# Patient Record
Sex: Male | Born: 1990 | Hispanic: No | Marital: Single | Smoking: Former smoker
Health system: Southern US, Community
[De-identification: ages and names within clinical notes are randomized; demographics above are authoritative.]

## PROBLEM LIST (undated history)

## (undated) HISTORY — PX: MANDIBLE SURGERY: SHX707

---
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
## 2011-01-21 LAB — POCT I-STAT, CHEM 8
BUN: 15 mg/dL (ref 6–23)
Calcium, Ion: 1.13 mmol/L (ref 1.12–1.32)
Chloride: 104 mEq/L (ref 96–112)
Creatinine, Ser: 0.9 mg/dL (ref 0.4–1.5)
Glucose, Bld: 102 mg/dL — ABNORMAL HIGH (ref 70–99)
Potassium: 3.7 mEq/L (ref 3.5–5.1)

## 2011-01-21 LAB — CBC
MCHC: 34.2 g/dL (ref 31.0–37.0)
MCV: 86.2 fL (ref 78.0–98.0)
Platelets: 221 10*3/uL (ref 150–400)
RDW: 13.1 % (ref 11.4–15.5)
WBC: 11.7 10*3/uL (ref 4.5–13.5)

## 2011-01-21 LAB — RAPID URINE DRUG SCREEN, HOSP PERFORMED
Amphetamines: NOT DETECTED
Benzodiazepines: NOT DETECTED
Cocaine: NOT DETECTED

## 2011-01-21 LAB — PROTIME-INR: Prothrombin Time: 13.4 seconds (ref 11.6–15.2)

## 2011-01-21 LAB — URINALYSIS, MICROSCOPIC ONLY
Bilirubin Urine: NEGATIVE
Glucose, UA: NEGATIVE mg/dL
Hgb urine dipstick: NEGATIVE
Ketones, ur: NEGATIVE mg/dL
Protein, ur: NEGATIVE mg/dL
Urobilinogen, UA: 0.2 mg/dL (ref 0.0–1.0)
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
## 2011-02-24 ENCOUNTER — Emergency Department (HOSPITAL_COMMUNITY): Payer: No Typology Code available for payment source

## 2011-02-24 ENCOUNTER — Emergency Department (HOSPITAL_COMMUNITY)
Admission: EM | Admit: 2011-02-24 | Discharge: 2011-02-24 | Disposition: A | Discharge status: Home / Self Care | Payer: No Typology Code available for payment source | Attending: Emergency Medicine | Admitting: Emergency Medicine

## 2011-02-24 DIAGNOSIS — M25529 Pain in unspecified elbow: Secondary | ICD-10-CM | POA: Insufficient documentation

## 2011-02-24 DIAGNOSIS — IMO0002 Reserved for concepts with insufficient information to code with codable children: Secondary | ICD-10-CM | POA: Insufficient documentation

## 2011-06-22 ENCOUNTER — Encounter: Payer: Self-pay | Admitting: *Deleted

## 2011-06-22 ENCOUNTER — Emergency Department (HOSPITAL_COMMUNITY)
Admission: EM | Admit: 2011-06-22 | Discharge: 2011-06-22 | Disposition: A | Discharge status: Home / Self Care | Payer: 59 | Attending: Emergency Medicine | Admitting: Emergency Medicine

## 2011-06-22 DIAGNOSIS — R200 Anesthesia of skin: Secondary | ICD-10-CM

## 2011-06-22 DIAGNOSIS — F17200 Nicotine dependence, unspecified, uncomplicated: Secondary | ICD-10-CM | POA: Insufficient documentation

## 2011-06-22 DIAGNOSIS — R209 Unspecified disturbances of skin sensation: Secondary | ICD-10-CM | POA: Insufficient documentation

## 2011-06-22 MED ORDER — IBUPROFEN 800 MG PO TABS
800.0000 mg | ORAL_TABLET | Freq: Once | ORAL | Status: AC
  Administered 2011-06-22: 800 mg via ORAL
  Filled 2011-06-22: qty 1

## 2011-06-22 NOTE — ED Provider Notes (Signed)
Medical screening examination/treatment/procedure(s) were performed by non-physician practitioner and as supervising physician I was immediately available for consultation/collaboration.  Donnetta Hutching, MD 06/22/11 [phone]

## 2011-06-22 NOTE — ED Notes (Signed)
Pt c/o numbness to his right index finger. Pt states his has contracts at night and in am he has to force his fingers open.

## 2011-06-22 NOTE — ED Provider Notes (Addendum)
History     CSN: [id number] Arrival date & time: 06/22/2011  5:06 PM   Chief Complaint  Patient presents with  . Numbness    right index finger     (Include location/radiation/quality/duration/timing/severity/associated sxs/prior treatment) HPI Comments: For thwe past 2 months pt has been awakening with numbness to distal radial aspect of R 2nd digit which improves during the day.  "sometimes my fingers cramp too".  The history is provided by the patient. No language interpreter was used.     History reviewed. No pertinent past medical history.   Past Surgical History  Procedure Date  . Mandible surgery     History reviewed. No pertinent family history.  History  Substance Use Topics  . Smoking status: Current Everyday Smoker -- 0.5 packs/day  . Smokeless tobacco: Not on file  . Alcohol Use: Yes     occasionally      Review of Systems  Neurological: Positive for numbness.  All other systems reviewed and are negative.    Allergies  Penicillins  Home Medications  No current outpatient prescriptions on file.  Physical Exam    BP 119/59  Pulse 69  Temp(Src) 98.5 F (36.9 C) (Oral)  Resp 20  Ht 5\' 11"  (1.803 m)  Wt 200 lb (90.719 kg)  BMI 27.89 kg/m2  SpO2 100%  Physical Exam  Nursing note and vitals reviewed. Constitutional: He is oriented to person, place, and time. Vital signs are normal. He appears well-developed and well-nourished. No distress.  HENT:  Head: Normocephalic and atraumatic.  Right Ear: External ear normal.  Left Ear: External ear normal.  Nose: Nose normal.  Mouth/Throat: No oropharyngeal exudate.  Eyes: Conjunctivae and EOM are normal. Pupils are equal, round, and reactive to light. Right eye exhibits no discharge. Left eye exhibits no discharge. No scleral icterus.  Neck: Normal range of motion. Neck supple. No JVD present. No tracheal deviation present. No thyromegaly present.  Cardiovascular: Normal rate, regular rhythm,  normal heart sounds, intact distal pulses and normal pulses.  Exam reveals no gallop and no friction rub.   No murmur heard. Pulmonary/Chest: Effort normal and breath sounds normal. No stridor. No respiratory distress. He has no wheezes. He has no rales. He exhibits no tenderness.  Abdominal: Soft. Normal appearance and bowel sounds are normal. He exhibits no distension and no mass. There is no tenderness. There is no rebound and no guarding.  Musculoskeletal: Normal range of motion. He exhibits no edema and no tenderness.       Hands: Lymphadenopathy:    He has no cervical adenopathy.  Neurological: He is alert and oriented to person, place, and time. He has normal reflexes. A sensory deficit is present. No cranial nerve deficit. He displays no seizure activity. Coordination normal. GCS eye subscore is 4. GCS verbal subscore is 5. GCS motor subscore is 6.  Skin: Skin is warm and dry. No rash noted. He is not diaphoretic.  Psychiatric: He has a normal mood and affect. His speech is normal and behavior is normal. Judgment and thought content normal. Cognition and memory are normal.    ED Course  Procedures  Results for orders placed during the hospital encounter of 10/21/08  ETHANOL      Component Value Range   Alcohol, Ethyl (B)   (*) 0 - 10 (mg/dL)   Value: 66            LOWEST DETECTABLE LIMIT FOR     SERUM ALCOHOL IS 5 mg/dL  FOR MEDICAL PURPOSES ONLY  CBC      Component Value Range   WBC 11.7  4.5 - 13.5 (K/uL)   RBC 5.18  3.80 - 5.70 (MIL/uL)   Hemoglobin 15.3  12.0 - 16.0 (g/dL)   HCT 40.9  81.1 - 91.4 (%)   MCV 86.2  78.0 - 98.0 (fL)   MCHC 34.2  31.0 - 37.0 (g/dL)   RDW 78.2  95.6 - 21.3 (%)   Platelets 221  150 - 400 (K/uL)  PROTIME-INR      Component Value Range   Prothrombin Time 13.4  11.6 - 15.2 (seconds)   INR 1.0  0.00 - 1.49   URINALYSIS, MICROSCOPIC ONLY      Component Value Range   Color, Urine YELLOW  YELLOW    Appearance CLEAR  CLEAR    Specific  Gravity, Urine 1.023  1.005 - 1.030    pH 7.0  5.0 - 8.0    Glucose, UA NEGATIVE  NEGATIVE (mg/dL)   Hgb urine dipstick NEGATIVE  NEGATIVE    Bilirubin Urine NEGATIVE  NEGATIVE    Ketones, ur NEGATIVE  NEGATIVE (mg/dL)   Protein, ur NEGATIVE  NEGATIVE (mg/dL)   Urobilinogen, UA 0.2  0.0 - 1.0 (mg/dL)   Nitrite NEGATIVE  NEGATIVE    Leukocytes, UA NEGATIVE  NEGATIVE    WBC, UA 0-2  <3 (WBC/hpf)   Squamous Epithelial / LPF RARE  RARE   POCT I-STAT, CHEM 8      Component Value Range   Sodium 142  135 - 145 (mEq/L)   Potassium 3.7  3.5 - 5.1 (mEq/L)   Chloride 104  96 - 112 (mEq/L)   BUN 15  6 - 23 (mg/dL)   Creatinine, Ser 0.9  0.4 - 1.5 (mg/dL)   Glucose, Bld 086 (*) 70 - 99 (mg/dL)   Calcium, Ion 5.78  4.69 - 1.32 (mmol/L)   TCO2 24  0 - 100 (mmol/L)   Hemoglobin 16.3 (*) 12.0 - 16.0 (g/dL)   HCT 62.9  52.8 - 41.3 (%)  DRUG SCREEN PANEL, EMERGENCY      Component Value Range   Opiates NONE DETECTED  NONE DETECTED    Cocaine NONE DETECTED  NONE DETECTED    Benzodiazepines NONE DETECTED  NONE DETECTED    Amphetamines NONE DETECTED  NONE DETECTED    Tetrahydrocannabinol NONE DETECTED  NONE DETECTED    Barbiturates    NONE DETECTED    Value: NONE DETECTED            DRUG SCREEN FOR MEDICAL PURPOSES     ONLY.  IF CONFIRMATION IS NEEDED     FOR ANY PURPOSE, NOTIFY LAB     WITHIN 5 DAYS.                LOWEST DETECTABLE LIMITS     FOR URINE DRUG SCREEN     Drug Class       Cutoff (ng/mL)     Amphetamine      1000     Barbiturate      200     Benzodiazepine   200     Tricyclics       300     Opiates          300     Cocaine          300     THC              50  No results found.   No diagnosis found.   MDM  Tinel's and Phalen's negative. HPI incorrectly states the R 2nd finger rather than the L 2nd finger.     Worthy Rancher, PA 06/22/11 1724  Worthy Rancher, PA 06/22/11 1729  Worthy Rancher, PA 07/06/11 [phone]

## 2011-07-12 NOTE — ED Provider Notes (Signed)
.  Medical screening examination/treatment/procedure(s) were performed by non-physician practitioner and as supervising physician I was immediately available for consultation/collaboration.  Donnetta Hutching, MD 07/12/11 2038

## 2011-07-18 LAB — STREP A DNA PROBE

## 2011-07-18 LAB — CBC
HCT: 41.8
Hemoglobin: 14.1
MCV: 83.8
Platelets: 231
RDW: 12.9

## 2011-07-18 LAB — DIFFERENTIAL
Basophils Absolute: 0
Eosinophils Absolute: 0
Eosinophils Relative: 0
Lymphocytes Relative: 26 — ABNORMAL LOW
Lymphs Abs: 3
Monocytes Absolute: 1.3 — ABNORMAL HIGH

## 2011-07-18 LAB — MONONUCLEOSIS SCREEN: Mono Screen: POSITIVE — AB

## 2011-07-18 LAB — RAPID STREP SCREEN (MED CTR MEBANE ONLY): Streptococcus, Group A Screen (Direct): NEGATIVE

## 2012-05-01 ENCOUNTER — Emergency Department (HOSPITAL_COMMUNITY)
Admission: EM | Admit: 2012-05-01 | Discharge: 2012-05-01 | Disposition: A | Discharge status: Home / Self Care | Payer: Self-pay | Attending: Emergency Medicine | Admitting: Emergency Medicine

## 2012-05-01 ENCOUNTER — Encounter (HOSPITAL_COMMUNITY): Payer: Self-pay

## 2012-05-01 DIAGNOSIS — S0181XA Laceration without foreign body of other part of head, initial encounter: Secondary | ICD-10-CM

## 2012-05-01 DIAGNOSIS — F17200 Nicotine dependence, unspecified, uncomplicated: Secondary | ICD-10-CM | POA: Insufficient documentation

## 2012-05-01 DIAGNOSIS — W269XXA Contact with unspecified sharp object(s), initial encounter: Secondary | ICD-10-CM | POA: Insufficient documentation

## 2012-05-01 DIAGNOSIS — S0180XA Unspecified open wound of other part of head, initial encounter: Secondary | ICD-10-CM | POA: Insufficient documentation

## 2012-05-01 MED ORDER — TETANUS-DIPHTH-ACELL PERTUSSIS 5-2.5-18.5 LF-MCG/0.5 IM SUSP
INTRAMUSCULAR | Status: AC
  Filled 2012-05-01: qty 0.5

## 2012-05-01 MED ORDER — TETANUS-DIPHTH-ACELL PERTUSSIS 5-2.5-18.5 LF-MCG/0.5 IM SUSP
0.5000 mL | Freq: Once | INTRAMUSCULAR | Status: AC
  Administered 2012-05-01: 0.5 mL via INTRAMUSCULAR

## 2012-05-01 NOTE — ED Provider Notes (Signed)
History     CSN: [id number]  Arrival date & time 05/01/12  1315   First MD Initiated Contact with Patient 05/01/12 1323      Chief Complaint  Patient presents with  . Laceration    (Consider location/radiation/quality/duration/timing/severity/associated sxs/prior treatment) HPI Pt sustained 1cm laceration to his chin about 4-5 days ago. Did not seek care at the time. Noticed some return of bleeding last night and so came for evaluation today. He has been applying antibiotic ointment and bandaid. No fever or drainage of pus. No pain.   History reviewed. No pertinent past medical history.  Past Surgical History  Procedure Date  . Mandible surgery     No family history on file.  History  Substance Use Topics  . Smoking status: Current Everyday Smoker -- 0.5 packs/day  . Smokeless tobacco: Not on file  . Alcohol Use: Yes     occasionally      Review of Systems All other systems reviewed and are negative except as noted in HPI.   Allergies  Penicillins  Home Medications  No current outpatient prescriptions on file.  BP 143/72  Pulse 65  Temp 98.4 F (36.9 C) (Oral)  Resp 20  Ht 5\' 10"  (1.778 m)  Wt 185 lb (83.915 kg)  BMI 26.54 kg/m2  SpO2 100%  Physical Exam  Constitutional: He is oriented to person, place, and time. He appears well-developed and well-nourished.  HENT:  Head: Normocephalic.       1cm healing laceration to chin, no signs of infection, too remote for repair.   Neck: Neck supple.  Pulmonary/Chest: Effort normal.  Neurological: He is alert and oriented to person, place, and time. No cranial nerve deficit.  Psychiatric: He has a normal mood and affect. His behavior is normal.    ED Course  Procedures (including critical care time)  Labs Reviewed - No data to display No results found.   No diagnosis found.    MDM  Remote laceration, no infection. Tdap, wound care instructions. PCP followup.         Charles B. Bernette Mayers,  MD 05/01/12 1328

## 2012-05-01 NOTE — ED Notes (Signed)
MD at bedside. 
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
## 2012-06-17 ENCOUNTER — Emergency Department (HOSPITAL_COMMUNITY)
Admission: EM | Admit: 2012-06-17 | Discharge: 2012-06-17 | Disposition: A | Discharge status: Home / Self Care | Payer: Self-pay | Attending: Emergency Medicine | Admitting: Emergency Medicine

## 2012-06-17 ENCOUNTER — Encounter (HOSPITAL_COMMUNITY): Payer: Self-pay

## 2012-06-17 DIAGNOSIS — M5432 Sciatica, left side: Secondary | ICD-10-CM

## 2012-06-17 DIAGNOSIS — M5430 Sciatica, unspecified side: Secondary | ICD-10-CM | POA: Insufficient documentation

## 2012-06-17 DIAGNOSIS — F17200 Nicotine dependence, unspecified, uncomplicated: Secondary | ICD-10-CM | POA: Insufficient documentation

## 2012-06-17 MED ORDER — OXYCODONE-ACETAMINOPHEN 5-325 MG PO TABS: 1.0000 | ORAL_TABLET | ORAL | Status: AC | PRN

## 2012-06-17 MED ORDER — CYCLOBENZAPRINE HCL 10 MG PO TABS: 10.0000 mg | ORAL_TABLET | Freq: Three times a day (TID) | ORAL | Status: AC | PRN

## 2012-06-17 MED ORDER — IBUPROFEN 800 MG PO TABS
800.0000 mg | ORAL_TABLET | Freq: Once | ORAL | Status: AC
  Administered 2012-06-17: 800 mg via ORAL
  Filled 2012-06-17: qty 1

## 2012-06-17 MED ORDER — OXYCODONE-ACETAMINOPHEN 5-325 MG PO TABS
1.0000 | ORAL_TABLET | Freq: Once | ORAL | Status: AC
  Administered 2012-06-17: 1 via ORAL
  Filled 2012-06-17: qty 1

## 2012-06-17 MED ORDER — CYCLOBENZAPRINE HCL 10 MG PO TABS
10.0000 mg | ORAL_TABLET | Freq: Once | ORAL | Status: AC
  Administered 2012-06-17: 10 mg via ORAL
  Filled 2012-06-17: qty 1

## 2012-06-17 NOTE — ED Provider Notes (Signed)
History   This chart was scribed for Bobby Booze, MD by Gerlean Ren. This patient was seen in room [facility] and the patient's care was started at 4:57PM.   CSN: [id number]  Arrival date & time 06/17/12  1623   First MD Initiated Contact with Patient 06/17/12 1650      Chief Complaint  Patient presents with  . Back Pain    (Consider location/radiation/quality/duration/timing/severity/associated sxs/prior treatment) Patient is a 21 y.o. male presenting with back pain. The history is provided by the patient. No language interpreter was used.  Back Pain   JASN XIA is a 21 y.o. male who presents to the Emergency Department complaining of 3 days of lower back pain radiating down left leg to the foot with associated tingling in left leg.  Pain is described as 10 out of 10 and is worsened when sitting and lifting both legs.  Pain is improved when standing.  Pain is not improved by tylenol or advil.  Pt denies h/o back pain.  Pt denies any heavy lifting or abnormal activity that may have caused pain.  Pt denies any bowel or bladder symptoms.  Pt is a current everyday smoker (0.5packs/day) and occasional alcohol use.     History reviewed. No pertinent past medical history.  Past Surgical History  Procedure Date  . Mandible surgery     No family history on file.  History  Substance Use Topics  . Smoking status: Current Every Day Smoker -- 0.5 packs/day  . Smokeless tobacco: Not on file  . Alcohol Use: Yes     occasionally      Review of Systems  Musculoskeletal: Positive for back pain.  All other systems reviewed and are negative.    Allergies  Penicillins  Home Medications  No current outpatient prescriptions on file.  BP 137/80  Pulse 86  Temp 98.3 F (36.8 C)  Resp 16  Ht 5\' 11"  (1.803 m)  Wt 185 lb (83.915 kg)  BMI 25.80 kg/m2  SpO2 100%  Physical Exam  Nursing note and vitals reviewed. Constitutional: He is oriented to person, place, and time. He  appears well-developed and well-nourished.  HENT:  Head: Normocephalic and atraumatic.  Eyes: Conjunctivae normal and EOM are normal. Pupils are equal, round, and reactive to light.  Neck: Normal range of motion. Neck supple.  Cardiovascular: Normal rate, regular rhythm and normal heart sounds.   Pulmonary/Chest: Effort normal and breath sounds normal.  Abdominal: Soft. Bowel sounds are normal.  Musculoskeletal: He exhibits tenderness.       Tingling down posterior lateral aspect of left calf and thigh. Mild tenderness in lower lumbar and left paralumbar region. Positive straight leg raise 15 degrees on left. Positive crossed straight leg raise 30 degrees on right.  Neurological: He is alert and oriented to person, place, and time.  Skin: Skin is warm and dry.  Psychiatric: He has a normal mood and affect.    ED Course  Procedures (including critical care time) DIAGNOSTIC STUDIES: Oxygen Saturation is 100% on room air, normal by my interpretation.    COORDINATION OF CARE: 4:58PM- Ordered percocet, flexeril, and ibuprofen for pain.     1. Left sided sciatica       MDM  He clearly has sciatica. I have discussed the cause with him as well as treatment. He will be given prescriptions for Flexeril and Percocet. He sees over-the-counter NSAIDs such as naproxen or ibuprofen.  I personally performed the services described in this documentation, which was  scribed in my presence. The recorded information has been reviewed and considered.          Bobby Booze, MD 06/17/12 1736

## 2012-06-17 NOTE — ED Notes (Signed)
Pt c/o back pain for 3 days that moves down left leg. Denies injury

## 2012-07-07 ENCOUNTER — Emergency Department (HOSPITAL_COMMUNITY)
Admission: EM | Admit: 2012-07-07 | Discharge: 2012-07-07 | Disposition: A | Discharge status: Home / Self Care | Payer: Self-pay | Attending: Emergency Medicine | Admitting: Emergency Medicine

## 2012-07-07 ENCOUNTER — Encounter (HOSPITAL_COMMUNITY): Payer: Self-pay

## 2012-07-07 ENCOUNTER — Emergency Department (HOSPITAL_COMMUNITY): Payer: Self-pay

## 2012-07-07 DIAGNOSIS — M545 Low back pain, unspecified: Secondary | ICD-10-CM | POA: Insufficient documentation

## 2012-07-07 DIAGNOSIS — Z880 Allergy status to penicillin: Secondary | ICD-10-CM | POA: Insufficient documentation

## 2012-07-07 DIAGNOSIS — F17200 Nicotine dependence, unspecified, uncomplicated: Secondary | ICD-10-CM | POA: Insufficient documentation

## 2012-07-07 DIAGNOSIS — M549 Dorsalgia, unspecified: Secondary | ICD-10-CM

## 2012-07-07 MED ORDER — OXYCODONE-ACETAMINOPHEN 5-325 MG PO TABS: 1.0000 | ORAL_TABLET | Freq: Four times a day (QID) | ORAL | Status: AC | PRN

## 2012-07-07 MED ORDER — PREDNISONE 10 MG PO TABS: 20.0000 mg | ORAL_TABLET | Freq: Every day | ORAL | Status: DC

## 2012-07-07 MED ORDER — CYCLOBENZAPRINE HCL 10 MG PO TABS: 10.0000 mg | ORAL_TABLET | Freq: Three times a day (TID) | ORAL | Status: DC | PRN

## 2012-07-07 MED ORDER — HYDROMORPHONE HCL PF 1 MG/ML IJ SOLN
1.0000 mg | Freq: Once | INTRAMUSCULAR | Status: AC
  Administered 2012-07-07: 1 mg via INTRAVENOUS
  Filled 2012-07-07: qty 1

## 2012-07-07 MED ORDER — ONDANSETRON HCL 4 MG/2ML IJ SOLN
4.0000 mg | Freq: Once | INTRAMUSCULAR | Status: AC
  Administered 2012-07-07: 4 mg via INTRAVENOUS
  Filled 2012-07-07: qty 2

## 2012-07-07 MED ORDER — METHYLPREDNISOLONE SODIUM SUCC 125 MG IJ SOLR
125.0000 mg | Freq: Once | INTRAMUSCULAR | Status: AC
  Administered 2012-07-07: 125 mg via INTRAVENOUS
  Filled 2012-07-07: qty 2

## 2012-07-07 NOTE — ED Notes (Signed)
Pt reports low back pain for 1 month was seen in er at that time, was given percocet at that time, is now out and unable to follow up with pmd.

## 2012-07-07 NOTE — ED Notes (Signed)
Patient with no complaints at this time. Respirations even and unlabored. Skin warm/dry. Discharge instructions reviewed with patient at this time. Patient given opportunity to voice concerns/ask questions. IV removed per policy and band-aid applied to site. Patient discharged at this time and left Emergency Department with steady gait.  

## 2012-07-07 NOTE — ED Notes (Signed)
[place], MD at bedside 

## 2012-07-07 NOTE — ED Provider Notes (Signed)
History  This chart was scribed for Bobby Lennert, MD by Ladona Ridgel Day. This patient was seen in room APA02/APA02 and the patient's care was started at 0858.   CSN: [id number]  Arrival date & time 07/07/12  7829   First MD Initiated Contact with Patient 07/07/12 1011      Chief Complaint  Patient presents with  . Back Pain   Patient is a 21 y.o. male presenting with back pain. The history is provided by the patient. No language interpreter was used.  Back Pain  This is a chronic problem. The current episode started more than 1 week ago. The problem occurs constantly. The problem has not changed since onset.The pain is associated with no known injury. The pain is present in the gluteal region and lumbar spine. The quality of the pain is described as shooting. The pain radiates to the left thigh. The pain is moderate. The symptoms are aggravated by bending. The pain is the same all the time. Pertinent negatives include no chest pain, no fever, no headaches, no abdominal pain and no bladder incontinence. Treatments tried: had flexeril and percocet when he was here 06/17/12 but his sx returned after he ran out of those medicines.  Bobby Hamilton is a 21 y.o. male who presents to the Emergency Department complaining of constant radiating back pain from his lumbar spine down his left leg for the past two months. He was seen here 06/17/12 for similar episode and states his symptoms returned once he was out of prescribed percocet and flexeril. 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 He states some associated tingling in his lower foot/toes and pain is worse with walking or bending at hips. He denies any alleviating factors.    History reviewed. No pertinent past medical history.  Past Surgical History  Procedure Date  . Mandible surgery     No family history on file.  History  Substance Use Topics  . Smoking status: Current Every Day Smoker --  0.5 packs/day    Types: Cigarettes  . Smokeless tobacco: Not on file  . Alcohol Use: Yes     occasionally      Review of Systems  Constitutional: Negative for fever and fatigue.  HENT: Negative for congestion, sinus pressure and ear discharge.   Eyes: Negative for discharge.  Respiratory: Negative for cough.   Cardiovascular: Negative for chest pain.  Gastrointestinal: Negative for abdominal pain and diarrhea.  Genitourinary: Negative for bladder incontinence, frequency and hematuria.  Musculoskeletal: Positive for back pain (lumbar back pain which radiates down his left leg).  Skin: Negative for rash.  Neurological: Negative for seizures and headaches.  Hematological: Negative.   Psychiatric/Behavioral: Negative for hallucinations.  All other systems reviewed and are negative.    Allergies  Penicillins  Home Medications   Current Outpatient Rx  Name Route Sig Dispense Refill  . CYCLOBENZAPRINE HCL 10 MG PO TABS Oral Take 10 mg by mouth 3 (three) times daily as needed. Muscle Spasms    . OXYCODONE-ACETAMINOPHEN 5-325 MG PO TABS Oral Take 1 tablet by mouth every 4 (four) hours as needed. Pain      Triage Vitals: BP 124/70  Pulse 84  Temp 98.2 F (36.8 C) (Oral)  Resp 20  Ht 5\' 10"  (1.778 m)  Wt 185 lb (83.915 kg)  BMI 26.54 kg/m2  SpO2 100%  Physical Exam  Nursing note and vitals reviewed. Constitutional: He is oriented to  person, place, and time. He appears well-developed.  HENT:  Head: Normocephalic.  Eyes: Conjunctivae normal are normal.  Neck: No tracheal deviation present.  Cardiovascular:  No murmur heard. Musculoskeletal: Normal range of motion. He exhibits tenderness (left lumbar spine tenderness).  Neurological: He is oriented to person, place, and time.       Positive left SLR   Skin: Skin is warm.  Psychiatric: He has a normal mood and affect.    ED Course  Procedures (including critical care time) DIAGNOSTIC STUDIES: Oxygen Saturation is  100% on room air, normal by my interpretation.    COORDINATION OF CARE: At 1015 AM Discussed treatment plan with patient which includes solu-medrol, dilaudid/zofran and lumbar spine X-ray. Patient agrees.   Labs Reviewed - No data to display No results found.   No diagnosis found.    MDM  The chart was scribed for me under my direct supervision.  I personally performed the history, physical, and medical decision making and all procedures in the evaluation of this patient.Bobby Lennert, MD 07/07/12 [phone]

## 2013-01-03 ENCOUNTER — Encounter (HOSPITAL_COMMUNITY): Payer: Self-pay | Admitting: Emergency Medicine

## 2013-01-03 ENCOUNTER — Emergency Department (HOSPITAL_COMMUNITY): Payer: Self-pay

## 2013-01-03 ENCOUNTER — Emergency Department (HOSPITAL_COMMUNITY)
Admission: EM | Admit: 2013-01-03 | Discharge: 2013-01-03 | Disposition: A | Discharge status: Home / Self Care | Payer: Self-pay | Attending: Emergency Medicine | Admitting: Emergency Medicine

## 2013-01-03 DIAGNOSIS — M79672 Pain in left foot: Secondary | ICD-10-CM

## 2013-01-03 DIAGNOSIS — Z87891 Personal history of nicotine dependence: Secondary | ICD-10-CM | POA: Insufficient documentation

## 2013-01-03 DIAGNOSIS — M79609 Pain in unspecified limb: Secondary | ICD-10-CM | POA: Insufficient documentation

## 2013-01-03 NOTE — ED Provider Notes (Signed)
History  This chart was scribed for Donnetta Hutching, MD, by Candelaria Stagers, ED Scribe. This patient was seen in room APA12/APA12 and the patient's care was started at 11:34 AM   CSN: [id number]  Arrival date & time 01/03/13  1028   First MD Initiated Contact with Patient 01/03/13 1100      Chief Complaint  Patient presents with  . Foot Pain    The history is provided by the patient. No language interpreter was used.   Bobby Hamilton is a 22 y.o. male who presents to the Emergency Department complaining of gradually worsening left foot pain that started yesterday after he reports he stepped on the foot wrong.  He reports that he is unable to bear weight on the foot.  Pt is also experiencing mild associated swelling to the left foot.       History reviewed. No pertinent past medical history.  Past Surgical History  Procedure Laterality Date  . Mandible surgery      History reviewed. No pertinent family history.  History  Substance Use Topics  . Smoking status: Former Smoker -- 0.50 packs/day  . Smokeless tobacco: Not on file  . Alcohol Use: Yes     Comment: occasionally      Review of Systems  Musculoskeletal: Positive for joint swelling (mild swelling to lateral dorsal aspect of left foot) and arthralgias (left foot pain).  All other systems reviewed and are negative.    Allergies  Penicillins  Home Medications  No current outpatient prescriptions on file.  BP 116/85  Pulse 58  Temp(Src) 98.2 F (36.8 C) (Oral)  Resp 17  Physical Exam  Nursing note and vitals reviewed. Constitutional: He is oriented to person, place, and time. He appears well-developed and well-nourished. No distress.  HENT:  Head: Normocephalic and atraumatic.  Eyes: EOM are normal.  Neck: Neck supple. No tracheal deviation present.  Musculoskeletal: Normal range of motion. He exhibits tenderness.  Tenderness over the dorsal lateral aspect of left foot.   Neurological: He is alert and  oriented to person, place, and time.  Neurovascularly intact.  Sensation normal.   Skin: Skin is warm and dry.  Psychiatric: He has a normal mood and affect. His behavior is normal.    ED Course  Procedures  DIAGNOSTIC STUDIES:   COORDINATION OF CARE:  11:37 AM Discussed images with pt and course of care which includes rest, ice, and elevation.  Pt understands and agrees.    Labs Reviewed - No data to display Dg Foot Complete Left  01/03/2013  *RADIOLOGY REPORT*  Clinical Data: Injury, pain  LEFT FOOT - COMPLETE 3+ VIEW  Comparison: None.  Findings: Normal alignment without fracture.  Preserved joint spaces.  No soft tissue abnormality or radiopaque foreign body.  IMPRESSION: No acute finding   Original Report Authenticated By: Judie Petit. Miles Costain, M.D.      No diagnosis found.    MDM  X-ray negative.  Ice, elevate, OTC pain med I personally performed the services described in this documentation, which was scribed in my presence. The recorded information has been reviewed and is accurate.        Donnetta Hutching, MD 01/11/13 1410

## 2013-01-03 NOTE — ED Notes (Signed)
Pt stepped wrong yesterday and now c/o L foot pain to lateral side. Has hx of fx in this foot also. Slight swelling noted.

## 2014-02-04 ENCOUNTER — Emergency Department (HOSPITAL_COMMUNITY)
Admission: EM | Admit: 2014-02-04 | Discharge: 2014-02-04 | Disposition: A | Discharge status: Home / Self Care | Payer: Managed Care, Other (non HMO) | Attending: Emergency Medicine | Admitting: Emergency Medicine

## 2014-02-04 ENCOUNTER — Encounter (HOSPITAL_COMMUNITY): Payer: Self-pay | Admitting: Emergency Medicine

## 2014-02-04 ENCOUNTER — Emergency Department (HOSPITAL_COMMUNITY): Payer: Managed Care, Other (non HMO)

## 2014-02-04 DIAGNOSIS — Y9383 Activity, rough housing and horseplay: Secondary | ICD-10-CM | POA: Insufficient documentation

## 2014-02-04 DIAGNOSIS — Z87891 Personal history of nicotine dependence: Secondary | ICD-10-CM | POA: Insufficient documentation

## 2014-02-04 DIAGNOSIS — IMO0002 Reserved for concepts with insufficient information to code with codable children: Secondary | ICD-10-CM | POA: Insufficient documentation

## 2014-02-04 DIAGNOSIS — Z880 Allergy status to penicillin: Secondary | ICD-10-CM | POA: Insufficient documentation

## 2014-02-04 DIAGNOSIS — Y929 Unspecified place or not applicable: Secondary | ICD-10-CM | POA: Insufficient documentation

## 2014-02-04 DIAGNOSIS — S20219A Contusion of unspecified front wall of thorax, initial encounter: Secondary | ICD-10-CM

## 2014-02-04 MED ORDER — ACETAMINOPHEN-CODEINE #3 300-30 MG PO TABS: 1.0000 | ORAL_TABLET | Freq: Four times a day (QID) | ORAL | Status: AC | PRN

## 2014-02-04 MED ORDER — METHOCARBAMOL 500 MG PO TABS: 500.0000 mg | ORAL_TABLET | Freq: Three times a day (TID) | ORAL | Status: AC

## 2014-02-04 MED ORDER — DICLOFENAC SODIUM 75 MG PO TBEC: 75.0000 mg | DELAYED_RELEASE_TABLET | Freq: Two times a day (BID) | ORAL | Status: AC

## 2014-02-04 NOTE — ED Provider Notes (Signed)
CSN: [id number]     Arrival date & time 02/04/14  [id number] History   First MD Initiated Contact with Patient 02/04/14 [phone]     Chief Complaint  Patient presents with  . Chest Pain     (Consider location/radiation/quality/duration/timing/severity/associated sxs/prior Treatment) HPI Comments: Patient is a 23 year old male who presents to the emergency department with complaint of right chest wall/rib area pain. The patient states that 4 days ago he was wrestling with a friend and he took a knee to the upper right rib area. Since that time he has had pain with movement, breathing, and certain positions. He denies any hemoptysis. He denies any high fevers. He denies any blood in the urine. And his been no blood in the stool. He denies being on any anticoagulation medications, and he denies any bleeding disorders. He presents now for evaluation because he continues to have pain in this area and is concerned about fracture.  Patient is a 23 y.o. male presenting with chest pain.  Chest Pain Associated symptoms: no abdominal pain, no back pain, no cough, no dizziness, no palpitations and no shortness of breath     History reviewed. No pertinent past medical history. Past Surgical History  Procedure Laterality Date  . Mandible surgery     History reviewed. No pertinent family history. History  Substance Use Topics  . Smoking status: Former Smoker -- 0.50 packs/day  . Smokeless tobacco: Not on file  . Alcohol Use: Yes     Comment: occasionally    Review of Systems  Constitutional: Negative for activity change.       All ROS Neg except as noted in HPI  HENT: Negative for nosebleeds.   Eyes: Negative for photophobia and discharge.  Respiratory: Negative for cough, shortness of breath, wheezing and stridor.   Cardiovascular: Positive for chest pain. Negative for palpitations.  Gastrointestinal: Negative for abdominal pain and blood in stool.  Genitourinary: Negative for dysuria, frequency and  hematuria.  Musculoskeletal: Negative for arthralgias, back pain and neck pain.  Skin: Negative.   Neurological: Negative for dizziness, seizures and speech difficulty.  Psychiatric/Behavioral: Negative for hallucinations and confusion.      Allergies  Penicillins  Home Medications   Prior to Admission medications   Not on File   BP 137/85  Pulse 76  Temp(Src) 98.2 F (36.8 C) (Oral)  Resp 18  SpO2 100% Physical Exam  Nursing note and vitals reviewed. Constitutional: He is oriented to person, place, and time. He appears well-developed and well-nourished.  Non-toxic appearance.  HENT:  Head: Normocephalic.  Right Ear: Tympanic membrane and external ear normal.  Left Ear: Tympanic membrane and external ear normal.  Eyes: EOM and lids are normal. Pupils are equal, round, and reactive to light.  Neck: Normal range of motion. Neck supple. Carotid bruit is not present.  Cardiovascular: Normal rate, regular rhythm, normal heart sounds, intact distal pulses and normal pulses.   Pulmonary/Chest: Breath sounds normal. No respiratory distress.  There is right upper chest wall tenderness to palpation and with change of position. There is no bruising appreciated. There is no palpable deformity appreciated. There is no crepitus present.  The patient speaks in complete sentences without problem.  Abdominal: Soft. Bowel sounds are normal. There is no tenderness. There is no guarding.  Musculoskeletal: Normal range of motion.  Lymphadenopathy:       Head (right side): No submandibular adenopathy present.       Head (left side): No submandibular adenopathy present.  He has no cervical adenopathy.  Neurological: He is alert and oriented to person, place, and time. He has normal strength. No cranial nerve deficit or sensory deficit.  Skin: Skin is warm and dry.  Psychiatric: He has a normal mood and affect. His speech is normal.    ED Course  Procedures (including critical care  time) Labs Review Labs Reviewed - No data to display  Imaging Review Dg Ribs Unilateral W/chest Right  02/04/2014   CLINICAL DATA:  Hit anterior right mid ribs 3 days ago  EXAM: RIGHT RIBS AND CHEST - 3+ VIEW  COMPARISON:  None.  FINDINGS: Heart size and vascular pattern normal. No consolidation effusion or pneumothorax. No rib fractures identified.  IMPRESSION: Negative.   Electronically Signed   By: Esperanza Heiraymond  Rubner M.D.   On: 02/04/2014 10:39     EKG Interpretation None      MDM X-ray of the chest and right ribs read as negative for fracture or pneumothorax.  Vital signs are well within normal limits. The pulse oximetry is 100% on room air. Within normal limits by my interpretation.  The plan at this time is for the patient to be given a sentence barometer to be used every 2-3 hours. He'll be given a prescription for diclofenac, baclofen, and Tylenol with Codeine. Patient is to follow with his primary physician if not improving.    Final diagnoses:  Contusion of ribs    *I have reviewed nursing notes, vital signs, and all appropriate lab and imaging results for this patient.Kathie Dike**    Elgene Coral M Kyonna Frier, PA-C 02/04/14 1110

## 2014-02-04 NOTE — ED Provider Notes (Signed)
Medical screening examination/treatment/procedure(s) were performed by non-physician practitioner and as supervising physician I was immediately available for consultation/collaboration.   EKG Interpretation None        Jaycey Gens L Micaella Gitto, MD 02/04/14 1540 

## 2014-02-04 NOTE — Discharge Instructions (Signed)
Your chest x-ray is negative for any problem with the lungs. Your rib x-ray is negative for rib fracture. Your oxygen level is 100%. Please use the incentive spirometer 2-3 times daily for the next 5 days. Please use Robaxin and Voltaren daily. May use Tylenol codeine for pain if needed. This medication and Chest Contusion A chest contusion is a deep bruise on your chest area. Contusions are the result of an injury that caused bleeding under the skin. A chest contusion may involve bruising of the skin, muscles, or ribs. The contusion may turn blue, purple, or yellow. Minor injuries will give you a painless contusion, but more severe contusions may stay painful and swollen for a few weeks. CAUSES  A contusion is usually caused by a blow, trauma, or direct force to an area of the body. SYMPTOMS   Swelling and redness of the injured area.  Discoloration of the injured area.  Tenderness and soreness of the injured area.  Pain. DIAGNOSIS  The diagnosis can be made by taking a history and performing a physical exam. An X-ray, CT scan, or MRI may be needed to determine if there were any associated injuries, such as broken bones (fractures) or internal injuries. TREATMENT  Often, the best treatment for a chest contusion is resting, icing, and applying cold compresses to the injured area. Deep breathing exercises may be recommended to reduce the risk of pneumonia. Over-the-counter medicines may also be recommended for pain control. HOME CARE INSTRUCTIONS   Put ice on the injured area.  Put ice in a plastic bag.  Place a towel between your skin and the bag.  Leave the ice on for 15-20 minutes, 03-04 times a day.  Only take over-the-counter or prescription medicines as directed by your caregiver. Your caregiver may recommend avoiding anti-inflammatory medicines (aspirin, ibuprofen, and naproxen) for 48 hours because these medicines may increase bruising.  Rest the injured area.  Perform  deep-breathing exercises as directed by your caregiver.  Stop smoking if you smoke.  Do not lift objects over 5 pounds (2.3 kg) for 3 days or longer if recommended by your caregiver. SEEK IMMEDIATE MEDICAL CARE IF:   You have increased bruising or swelling.  You have pain that is getting worse.  You have difficulty breathing.  You have dizziness, weakness, or fainting.  You have blood in your urine or stool.  You cough up or vomit blood.  Your swelling or pain is not relieved with medicines. MAKE SURE YOU:   Understand these instructions.  Will watch your condition.  Will get help right away if you are not doing well or get worse. Document Released: 06/18/2001 Document Revised: 06/17/2012 Document Reviewed: 03/16/2012 [facility]ExitCare Patient Information 2014 [place] ValleyExitCare, [place]LL[place].  the Robaxin may cause drowsiness, please do not drive or operate machinery while taking these medications, please use these medications with caution.

## 2014-02-04 NOTE — ED Notes (Signed)
Pt wrestling with friend and was had a knee go into his lateral/anterior right side ribs x 4 days ago. Pain worse with movement/breathing hard. Very mild swelling noted. No resp distress. nad

## 2015-09-23 IMAGING — CR DG RIBS W/ CHEST 3+V*R*
4 series · 4 of 4 positions shown · non-contrast
Comparison: None.

CLINICAL DATA: Hit anterior right mid ribs 3 days ago

EXAM:
RIGHT RIBS AND CHEST - 3+ VIEW

[view not recorded (1 of 4)]
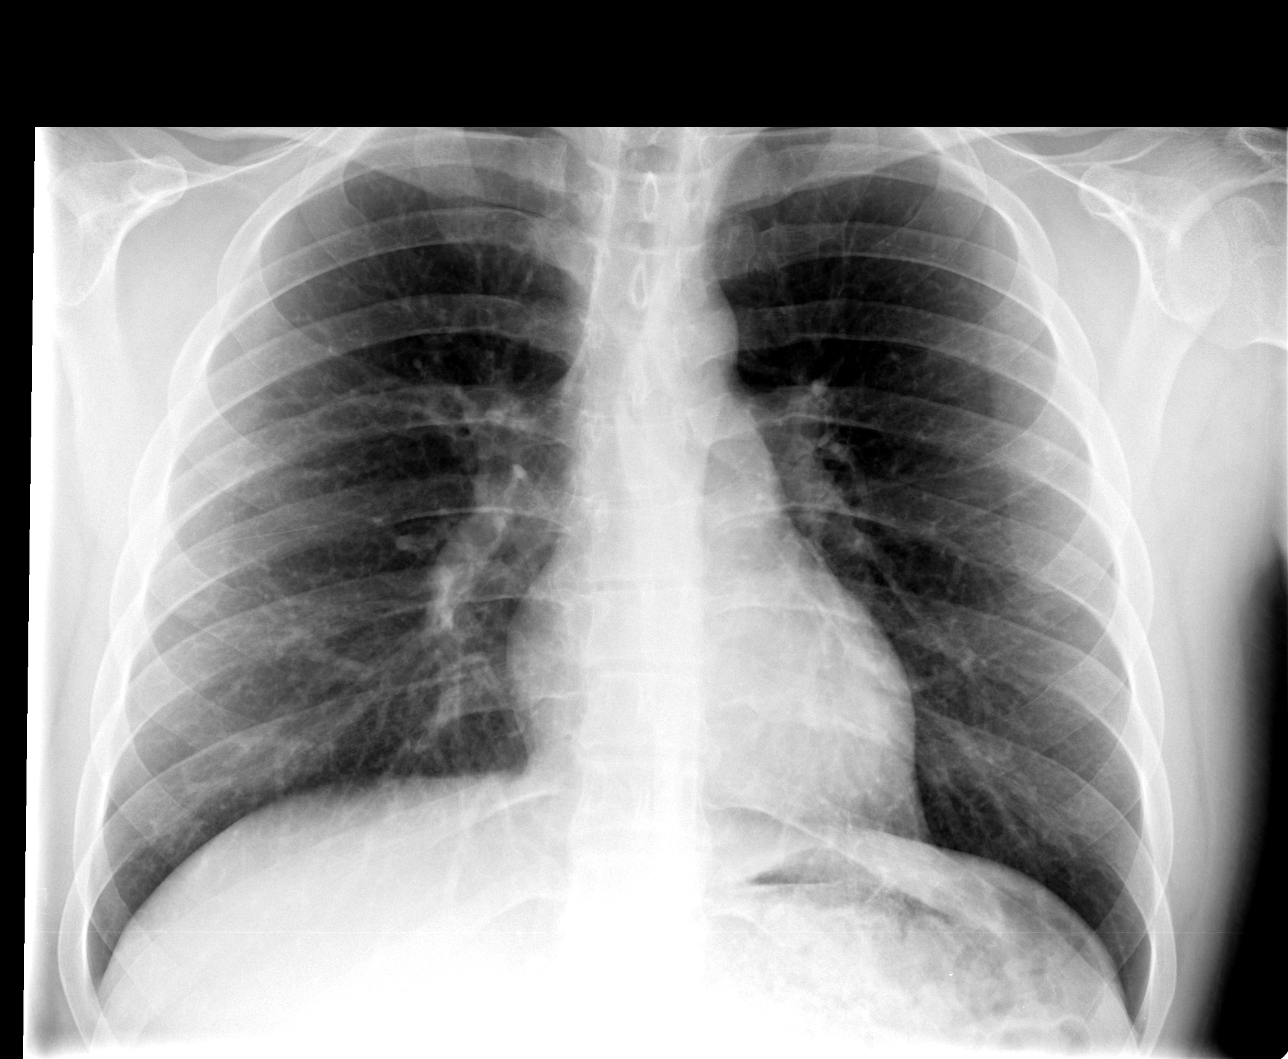

[view not recorded (2 of 4)]
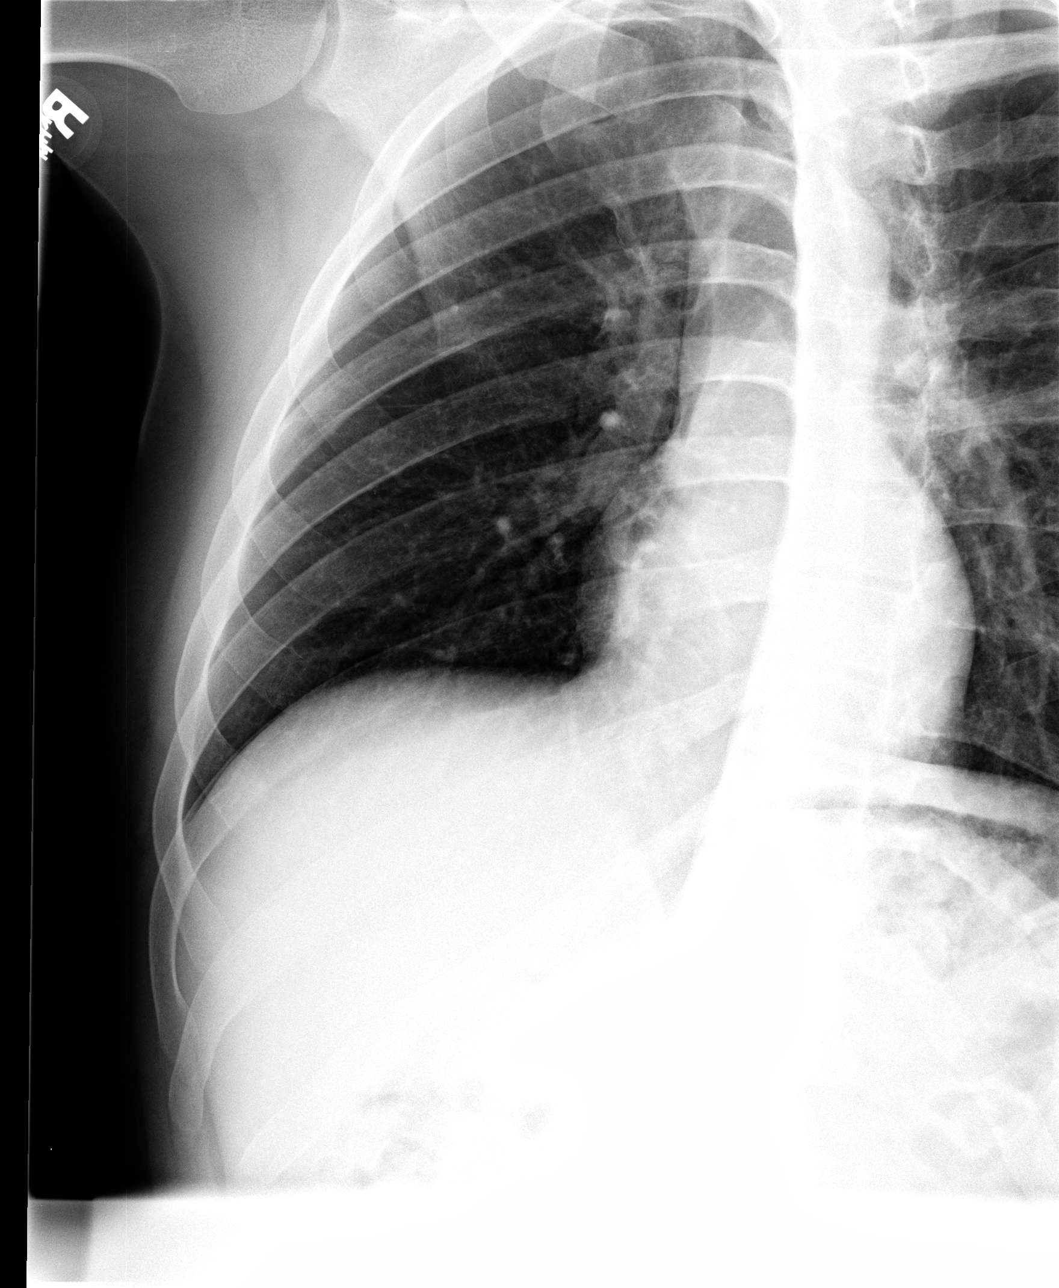

[view not recorded (3 of 4)]
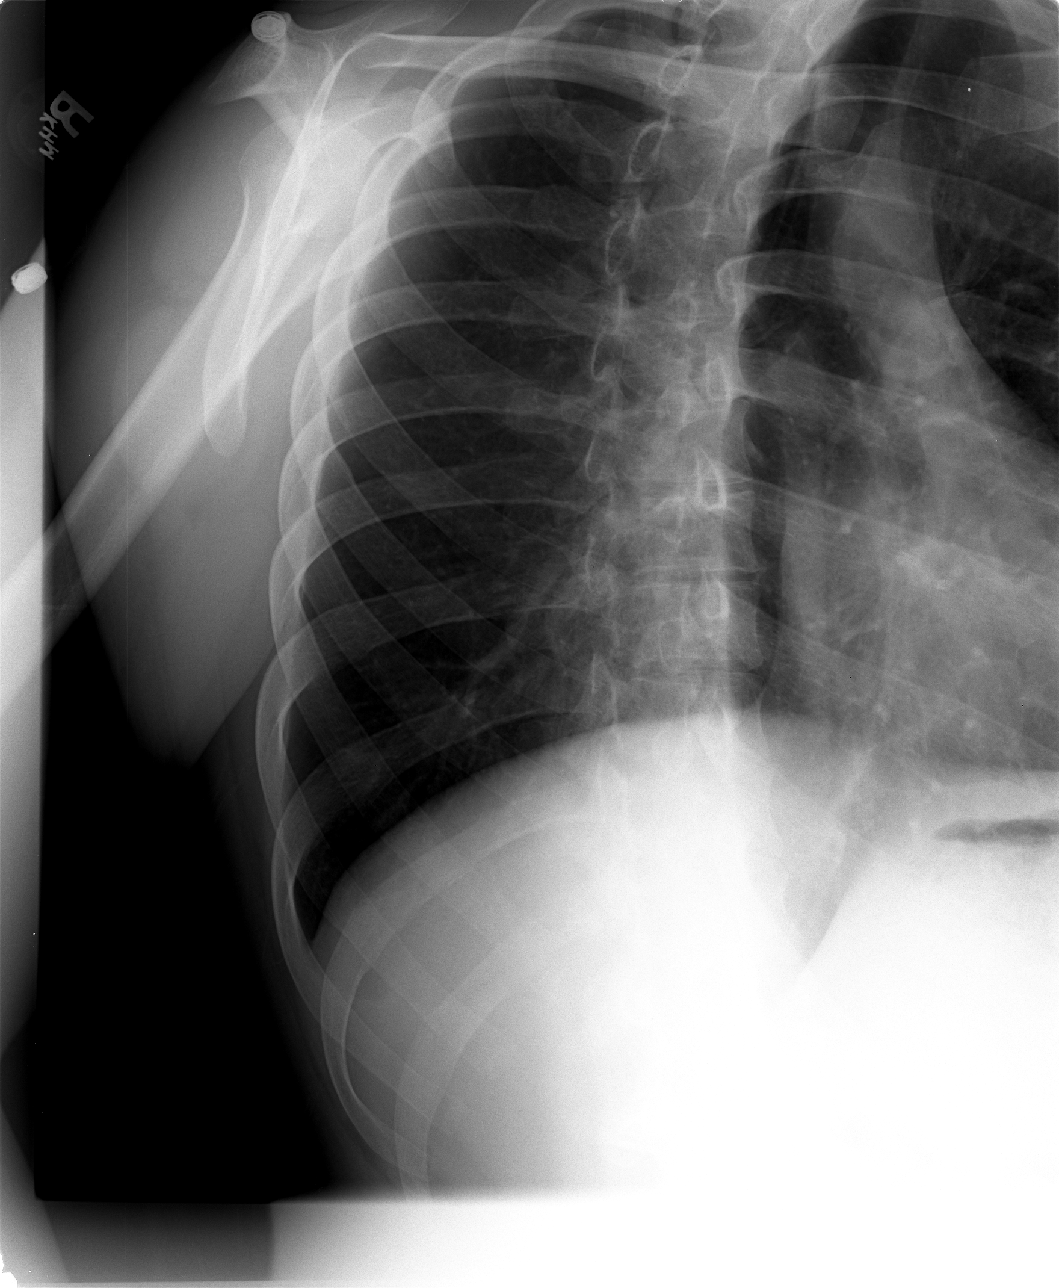

[view not recorded (4 of 4)]
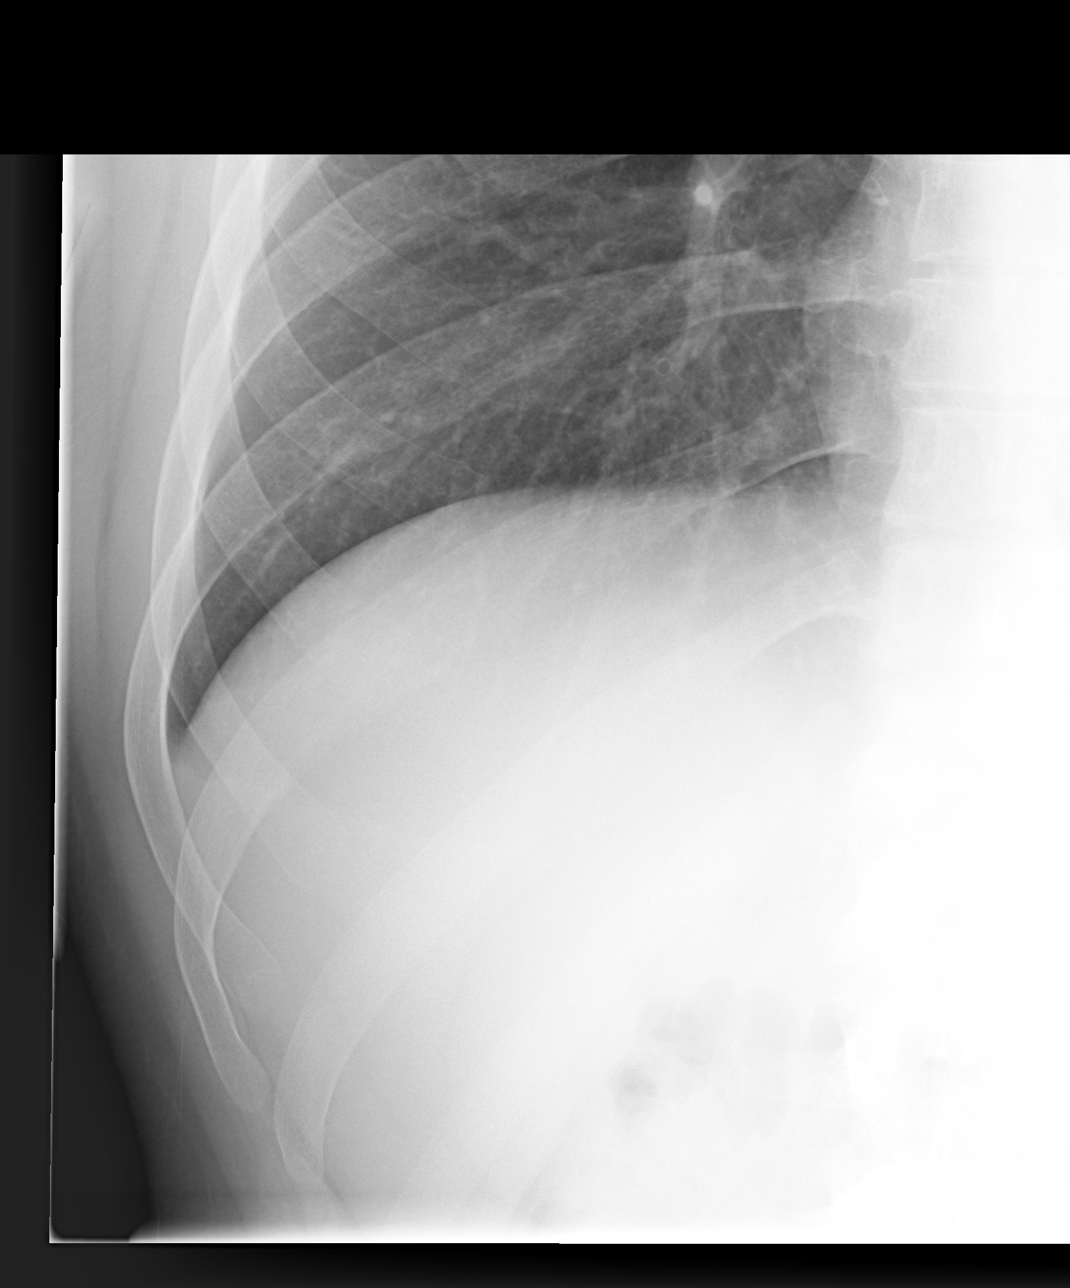

[4 of 4 positions shown; findings below may reference images not displayed]

FINDINGS: Heart size and vascular pattern normal. No consolidation effusion or
pneumothorax. No rib fractures identified.
IMPRESSION: Negative.
